# Patient Record
Sex: Male | Born: 1975 | Race: White | Hispanic: No | Smoking: Current every day smoker
Health system: Southern US, Community
[De-identification: ages and names within clinical notes are randomized; demographics above are authoritative.]

---

## 2014-08-31 ENCOUNTER — Encounter (HOSPITAL_COMMUNITY): Payer: Self-pay

## 2014-08-31 ENCOUNTER — Emergency Department (HOSPITAL_COMMUNITY)
Admission: EM | Admit: 2014-08-31 | Discharge: 2014-08-31 | Disposition: A | Discharge status: Home / Self Care | Attending: Emergency Medicine | Admitting: Emergency Medicine

## 2014-08-31 ENCOUNTER — Emergency Department (HOSPITAL_COMMUNITY)

## 2014-08-31 DIAGNOSIS — N201 Calculus of ureter: Secondary | ICD-10-CM | POA: Diagnosis not present

## 2014-08-31 DIAGNOSIS — Z720 Tobacco use: Secondary | ICD-10-CM | POA: Diagnosis not present

## 2014-08-31 DIAGNOSIS — R109 Unspecified abdominal pain: Secondary | ICD-10-CM

## 2014-08-31 LAB — CBC WITH DIFFERENTIAL/PLATELET
BASOS PCT: 0 % (ref 0–1)
Basophils Absolute: 0 10*3/uL (ref 0.0–0.1)
Eosinophils Absolute: 0.1 10*3/uL (ref 0.0–0.7)
Eosinophils Relative: 1 % (ref 0–5)
HCT: 41.1 % (ref 39.0–52.0)
Hemoglobin: 13.8 g/dL (ref 13.0–17.0)
LYMPHS ABS: 0.8 10*3/uL (ref 0.7–4.0)
Lymphocytes Relative: 12 % (ref 12–46)
MCH: 30.1 pg (ref 26.0–34.0)
MCHC: 33.6 g/dL (ref 30.0–36.0)
MCV: 89.5 fL (ref 78.0–100.0)
MONOS PCT: 5 % (ref 3–12)
Monocytes Absolute: 0.4 10*3/uL (ref 0.1–1.0)
NEUTROS ABS: 5.6 10*3/uL (ref 1.7–7.7)
Neutrophils Relative %: 82 % — ABNORMAL HIGH (ref 43–77)
PLATELETS: 170 10*3/uL (ref 150–400)
RBC: 4.59 MIL/uL (ref 4.22–5.81)
RDW: 13 % (ref 11.5–15.5)
WBC: 6.9 10*3/uL (ref 4.0–10.5)

## 2014-08-31 LAB — BASIC METABOLIC PANEL
Anion gap: 5 (ref 5–15)
BUN: 21 mg/dL (ref 6–23)
CHLORIDE: 109 mmol/L (ref 96–112)
CO2: 26 mmol/L (ref 19–32)
Calcium: 9.2 mg/dL (ref 8.4–10.5)
Creatinine, Ser: 0.97 mg/dL (ref 0.50–1.35)
GFR calc Af Amer: >90 mL/min (ref >90)
GFR calc non Af Amer: >90 mL/min (ref >90)
GLUCOSE: 97 mg/dL (ref 70–99)
POTASSIUM: 3.5 mmol/L (ref 3.5–5.1)
SODIUM: 140 mmol/L (ref 135–145)

## 2014-08-31 MED ORDER — SODIUM CHLORIDE 0.9 % IV SOLN
1000.0000 mL | Freq: Once | INTRAVENOUS | Status: AC
  Administered 2014-08-31: 1000 mL via INTRAVENOUS

## 2014-08-31 MED ORDER — ONDANSETRON HCL 4 MG/2ML IJ SOLN
4.0000 mg | Freq: Once | INTRAMUSCULAR | Status: AC
  Administered 2014-08-31: 4 mg via INTRAVENOUS
  Filled 2014-08-31: qty 2

## 2014-08-31 MED ORDER — NAPROXEN 500 MG PO TABS: 500.0000 mg | ORAL_TABLET | Freq: Two times a day (BID) | ORAL | Status: AC

## 2014-08-31 MED ORDER — MORPHINE SULFATE 4 MG/ML IJ SOLN
4.0000 mg | Freq: Once | INTRAMUSCULAR | Status: AC
  Administered 2014-08-31: 4 mg via INTRAVENOUS
  Filled 2014-08-31: qty 1

## 2014-08-31 MED ORDER — KETOROLAC TROMETHAMINE 30 MG/ML IJ SOLN
30.0000 mg | Freq: Once | INTRAMUSCULAR | Status: AC
  Administered 2014-08-31: 30 mg via INTRAVENOUS
  Filled 2014-08-31: qty 1

## 2014-08-31 MED ORDER — SODIUM CHLORIDE 0.9 % IV SOLN
1000.0000 mL | INTRAVENOUS | Status: DC
  Administered 2014-08-31: 1000 mL via INTRAVENOUS

## 2014-08-31 NOTE — Discharge Instructions (Signed)
Kidney Stones °Kidney stones (urolithiasis) are deposits that form inside your kidneys. The intense pain is caused by the stone moving through the urinary tract. When the stone moves, the ureter goes into spasm around the stone. The stone is usually passed in the urine.  °CAUSES  °· A disorder that makes certain neck glands produce too much parathyroid hormone (primary hyperparathyroidism). °· A buildup of uric acid crystals, similar to gout in your joints. °· Narrowing (stricture) of the ureter. °· A kidney obstruction present at birth (congenital obstruction). °· Previous surgery on the kidney or ureters. °· Numerous kidney infections. °SYMPTOMS  °· Feeling sick to your stomach (nauseous). °· Throwing up (vomiting). °· Blood in the urine (hematuria). °· Pain that usually spreads (radiates) to the groin. °· Frequency or urgency of urination. °DIAGNOSIS  °· Taking a history and physical exam. °· Blood or urine tests. °· CT scan. °· Occasionally, an examination of the inside of the urinary bladder (cystoscopy) is performed. °TREATMENT  °· Observation. °· Increasing your fluid intake. °· Extracorporeal shock wave lithotripsy--This is a noninvasive procedure that uses shock waves to break up kidney stones. °· Surgery may be needed if you have severe pain or persistent obstruction. There are various surgical procedures. Most of the procedures are performed with the use of small instruments. Only small incisions are needed to accommodate these instruments, so recovery time is minimized. °The size, location, and chemical composition are all important variables that will determine the proper choice of action for you. Talk to your health care provider to better understand your situation so that you will minimize the risk of injury to yourself and your kidney.  °HOME CARE INSTRUCTIONS  °· Drink enough water and fluids to keep your urine clear or pale yellow. This will help you to pass the stone or stone fragments. °· Strain  all urine through the provided strainer. Keep all particulate matter and stones for your health care provider to see. The stone causing the pain may be as small as a grain of salt. It is very important to use the strainer each and every time you pass your urine. The collection of your stone will allow your health care provider to analyze it and verify that a stone has actually passed. The stone analysis will often identify what you can do to reduce the incidence of recurrences. °· Only take over-the-counter or prescription medicines for pain, discomfort, or fever as directed by your health care provider. °· Make a follow-up appointment with your health care provider as directed. °· Get follow-up X-rays if required. The absence of pain does not always mean that the stone has passed. It may have only stopped moving. If the urine remains completely obstructed, it can cause loss of kidney function or even complete destruction of the kidney. It is your responsibility to make sure X-rays and follow-ups are completed. Ultrasounds of the kidney can show blockages and the status of the kidney. Ultrasounds are not associated with any radiation and can be performed easily in a matter of minutes. °SEEK MEDICAL CARE IF: °· You experience pain that is progressive and unresponsive to any pain medicine you have been prescribed. °SEEK IMMEDIATE MEDICAL CARE IF:  °· Pain cannot be controlled with the prescribed medicine. °· You have a fever or shaking chills. °· The severity or intensity of pain increases over 18 hours and is not relieved by pain medicine. °· You develop a new onset of abdominal pain. °· You feel faint or pass out. °·   You are unable to urinate. MAKE SURE YOU:   Understand these instructions.  Will watch your condition.  Will get help right away if you are not doing well or get worse. Document Released: 06/09/2005 Document Revised: 02/09/2013 Document Reviewed: 11/10/2012 [facility] Patient Information 2015  [place], [place]. This information is not intended to replace advice given to you by your health care provider. Make sure you discuss any questions you have with your health care provider.  Naproxen and naproxen sodium oral immediate-release tablets What is this medicine? NAPROXEN (na PROX en) is a non-steroidal anti-inflammatory drug (NSAID). It is used to reduce swelling and to treat pain. This medicine may be used for dental pain, headache, or painful monthly periods. It is also used for painful joint and muscular problems such as arthritis, tendinitis, bursitis, and gout. This medicine may be used for other purposes; ask your health care provider or pharmacist if you have questions. COMMON BRAND NAME(S): Aflaxen, Aleve, Aleve Arthritis, All Day Relief, Anaprox, Anaprox DS, Naprosyn What should I tell my health care provider before I take this medicine? They need to know if you have any of these conditions: -asthma -cigarette smoker -drink more than 3 alcohol containing drinks a day -heart disease or circulation problems such as heart failure or leg edema (fluid retention) -high blood pressure -kidney disease -liver disease -stomach bleeding or ulcers -an unusual or allergic reaction to naproxen, aspirin, other NSAIDs, other medicines, foods, dyes, or preservatives -pregnant or trying to get pregnant -breast-feeding How should I use this medicine? Take this medicine by mouth with a glass of water. Follow the directions on the prescription label. Take it with food if your stomach gets upset. Try to not lie down for at least 10 minutes after you take it. Take your medicine at regular intervals. Do not take your medicine more often than directed. Long-term, continuous use may increase the risk of heart attack or stroke. A special MedGuide will be given to you by the pharmacist with each prescription and refill. Be sure to read this information carefully each time. Talk to your pediatrician  regarding the use of this medicine in children. Special care may be needed. Overdosage: If you think you have taken too much of this medicine contact a poison control center or emergency room at once. NOTE: This medicine is only for you. Do not share this medicine with others. What if I miss a dose? If you miss a dose, take it as soon as you can. If it is almost time for your next dose, take only that dose. Do not take double or extra doses. What may interact with this medicine? -alcohol -aspirin -cidofovir -diuretics -lithium -methotrexate -other drugs for inflammation like ketorolac or prednisone -pemetrexed -probenecid -warfarin This list may not describe all possible interactions. Give your health care provider a list of all the medicines, herbs, non-prescription drugs, or dietary supplements you use. Also tell them if you smoke, drink alcohol, or use illegal drugs. Some items may interact with your medicine. What should I watch for while using this medicine? Tell your doctor or health care professional if your pain does not get better. Talk to your doctor before taking another medicine for pain. Do not treat yourself. This medicine does not prevent heart attack or stroke. In fact, this medicine may increase the chance of a heart attack or stroke. The chance may increase with longer use of this medicine and in people who have heart disease. If you take aspirin to prevent heart  attack or stroke, talk with your doctor or health care professional. Do not take other medicines that contain aspirin, ibuprofen, or naproxen with this medicine. Side effects such as stomach upset, nausea, or ulcers may be more likely to occur. Many medicines available without a prescription should not be taken with this medicine. This medicine can cause ulcers and bleeding in the stomach and intestines at any time during treatment. Do not smoke cigarettes or drink alcohol. These increase irritation to your stomach and  can make it more susceptible to damage from this medicine. Ulcers and bleeding can happen without warning symptoms and can cause death. You may get drowsy or dizzy. Do not drive, use machinery, or do anything that needs mental alertness until you know how this medicine affects you. Do not stand or sit up quickly, especially if you are an older patient. This reduces the risk of dizzy or fainting spells. This medicine can cause you to bleed more easily. Try to avoid damage to your teeth and gums when you brush or floss your teeth. What side effects may I notice from receiving this medicine? Side effects that you should report to your doctor or health care professional as soon as possible: -black or bloody stools, blood in the urine or vomit -blurred vision -chest pain -difficulty breathing or wheezing -nausea or vomiting -severe stomach pain -skin rash, skin redness, blistering or peeling skin, hives, or itching -slurred speech or weakness on one side of the body -swelling of eyelids, throat, lips -unexplained weight gain or swelling -unusually weak or tired -yellowing of eyes or skin Side effects that usually do not require medical attention (report to your doctor or health care professional if they continue or are bothersome): -constipation -headache -heartburn This list may not describe all possible side effects. Call your doctor for medical advice about side effects. You may report side effects to FDA at 1-800-FDA-1088. Where should I keep my medicine? Keep out of the reach of children. Store at room temperature between 15 and 30 degrees C (59 and 86 degrees F). Keep container tightly closed. Throw away any unused medicine after the expiration date. NOTE: This sheet is a summary. It may not cover all possible information. If you have questions about this medicine, talk to your doctor, pharmacist, or health care provider.  2015, [employer]. (2009-06-11 20:10:16)

## 2014-08-31 NOTE — ED Notes (Signed)
Left flank pain all night, intermittently worse at times

## 2014-08-31 NOTE — ED Provider Notes (Signed)
CSN: [id number]     Arrival date & time 08/31/14  0422 History   First MD Initiated Contact with Patient 08/31/14 [phone]     Chief Complaint  Patient presents with  . Flank Pain     (Consider location/radiation/quality/duration/timing/severity/associated sxs/prior Treatment) Patient is a 39 y.o. male presenting with flank pain. The history is provided by the patient.  Flank Pain  He had onset about midnight of severe left flank pain. Pain waxes and wanes. It is severe and he rates it at 10/10. Nothing makes it better nothing makes it worse. There is slight radiation to the left midabdomen. He denies fever, chills, sweats. There was nausea and vomiting. He thought he had to have a bowel movement but pain did not get any better after having a bowel movement. He denies any difficulty urinating. His never had pain like this before. There has been no treatment prior to arriving in the ED.  History reviewed. No pertinent past medical history. History reviewed. No pertinent past surgical history. No family history on file. History  Substance Use Topics  . Smoking status: Current Every Day Smoker  . Smokeless tobacco: Not on file  . Alcohol Use: No    Review of Systems  Genitourinary: Positive for flank pain.  All other systems reviewed and are negative.     Allergies  Review of patient's allergies indicates no known allergies.  Home Medications   Prior to Admission medications   Not on File   BP 117/55 mmHg  Pulse 63  Temp(Src) 98.8 F (37.1 C) (Oral)  Resp 20  Ht  (1.702 m)  Wt 177 lb (80.287 kg)  BMI 27.72 kg/m2  SpO2 98% Physical Exam  Nursing note and vitals reviewed.  39 year old male, resting comfortably and in no acute distress. Vital signs are normal. Oxygen saturation is 98%, which is normal. Head is normocephalic and atraumatic. PERRLA, EOMI. Oropharynx is clear. Neck is nontender and supple without adenopathy or JVD. Back is nontender in the midline.  There is moderate left CVA tenderness. Lungs are clear without rales, wheezes, or rhonchi. Chest is nontender. Heart has regular rate and rhythm without murmur. Abdomen is soft, flat, nontender without masses or hepatosplenomegaly and peristalsis is hypoactive. Extremities have no cyanosis or edema, full range of motion is present. Skin is warm and dry without rash. Neurologic: Mental status is normal, cranial nerves are intact, there are no motor or sensory deficits.  ED Course  Procedures (including critical care time) Labs Review Results for orders placed or performed during the hospital encounter of 08/31/14  Basic metabolic panel  Result Value Ref Range   Sodium 140 135 - 145 mmol/L   Potassium 3.5 3.5 - 5.1 mmol/L   Chloride 109 96 - 112 mmol/L   CO2 26 19 - 32 mmol/L   Glucose, Bld 97 70 - 99 mg/dL   BUN 21 6 - 23 mg/dL   Creatinine, Ser 2.95 0.50 - 1.35 mg/dL   Calcium 9.2 8.4 - 28.4 mg/dL   GFR calc non Af Amer >90 >90 mL/min   GFR calc Af Amer >90 >90 mL/min   Anion gap 5 5 - 15  CBC with Differential  Result Value Ref Range   WBC 6.9 4.0 - 10.5 K/uL   RBC 4.59 4.22 - 5.81 MIL/uL   Hemoglobin 13.8 13.0 - 17.0 g/dL   HCT 13.2 44.0 - 10.2 %   MCV 89.5 78.0 - 100.0 fL   MCH 30.1 26.0 -  34.0 pg   MCHC 33.6 30.0 - 36.0 g/dL   RDW 16.113.0 09.611.5 - 04.515.5 %   Platelets 170 150 - 400 K/uL   Neutrophils Relative % 82 (H) 43 - 77 %   Neutro Abs 5.6 1.7 - 7.7 K/uL   Lymphocytes Relative 12 12 - 46 %   Lymphs Abs 0.8 0.7 - 4.0 K/uL   Monocytes Relative 5 3 - 12 %   Monocytes Absolute 0.4 0.1 - 1.0 K/uL   Eosinophils Relative 1 0 - 5 %   Eosinophils Absolute 0.1 0.0 - 0.7 K/uL   Basophils Relative 0 0 - 1 %   Basophils Absolute 0.0 0.0 - 0.1 K/uL   Imaging Review Ct Renal Stone Study  08/31/2014   CLINICAL DATA:  Initial evaluation for acute left-sided flank pain.  EXAM: CT ABDOMEN AND PELVIS WITHOUT CONTRAST  TECHNIQUE: Multidetector CT imaging of the abdomen and pelvis was  performed following the standard protocol without IV contrast.  COMPARISON:  None.  FINDINGS: The visualized lung bases are clear. No pleural or pericardial effusion.  The liver demonstrates a normal unenhanced appearance. Gallbladder within normal limits. No biliary dilatation. Spleen, adrenal glands, and pancreas are within normal limits.  On the right, there is a nonobstructive 3 mm calculus within the upper pole the right kidney. No radiopaque stone seen along the course of the right renal collecting system. There is no right-sided hydronephrosis or hydroureter.  On the left, there is a in obstructive 3 mm stone in the bladder lumen near the left UVJ. There is secondary a mild left hydroureteronephrosis. No other radiopaque calculi identified within the left kidney or along the course of the left renal collecting system.  Stomach within normal limits. No evidence for bowel obstruction. No abnormal wall thickening or inflammatory changes seen about the bowels. No evidence for acute appendicitis.  Bladder within normal limits.  Prostate normal.  No free air or fluid.  No adenopathy.  No acute osseous abnormality. No worrisome lytic or blastic osseous lesions.  IMPRESSION: 1. 3 mm calculus within the bladder lumen just beyond the left UVJ with secondary mild left hydroureteronephrosis. No other left-sided calculi identified. 2. 3 mm nonobstructive right renal calculus. 3. No other acute intra-abdominal or pelvic process.   Electronically Signed   By: Rise MuBenjamin  McClintock M.D.   On: 08/31/2014 05:42   Images viewed by me.  MDM   Final diagnoses:  Left flank pain  Ureterolithiasis    Left flank pain suspicious for ureteral colic. He will be sent for CT scan. Is given IV fluids, ondansetron for nausea, and morphine for pain. Old records are reviewed and he has no [facility] and [facility].  He feels much better after above noted treatment. CT scan shows calculus either at the distal  ureterovesical junction or in the bladder. He is a dose of ketorolac. Following this, he is pain-free. Official reading of CT scan is appreciated. Since stone has already passed into bladder, he does not need to be on tamsulosin. He is discharged with prescription for naproxen.  Dione Boozeavid Seven Dollens, MD 08/31/14 0700

## 2015-10-29 IMAGING — CT CT RENAL STONE PROTOCOL
2 of 4 series · 16 of 46 positions shown, 18 images · non-contrast
Comparison: None.

CLINICAL DATA: Initial evaluation for acute left-sided flank pain.

EXAM:
CT ABDOMEN AND PELVIS WITHOUT CONTRAST
TECHNIQUE: Multidetector CT imaging of the abdomen and pelvis was performed
following the standard protocol without IV contrast.

[Series 2: standard/full over (age)lbs 5.0 · axial · 0.71mm/px · z∈[-530,-124]mm · 13 of 91 slices shown, 15 images]
[im 5/91  soft-tissue]
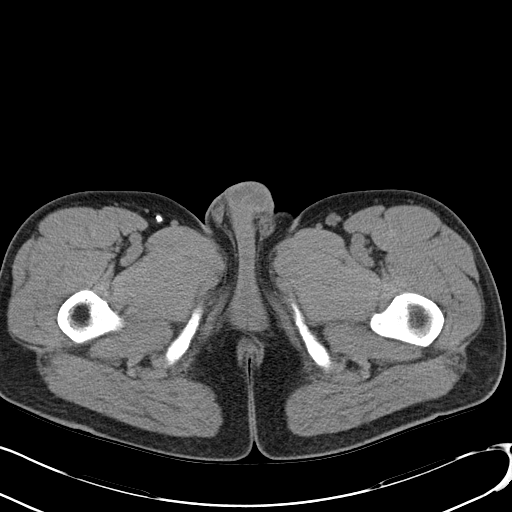
[im 5/91  bone]
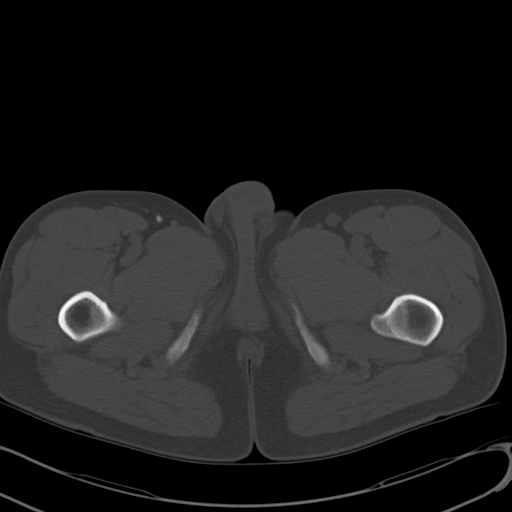
[im 13/91  soft-tissue]
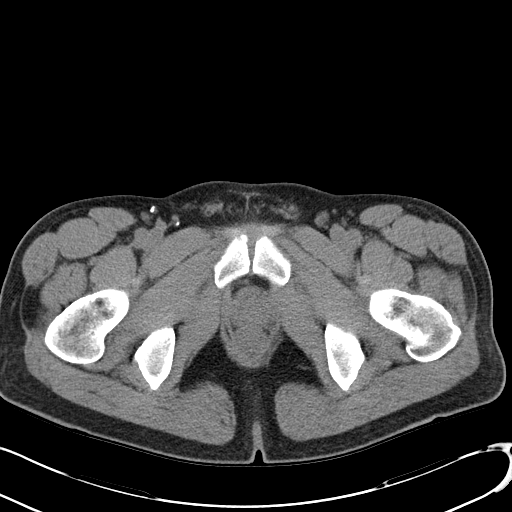
[im 21/91  soft-tissue]
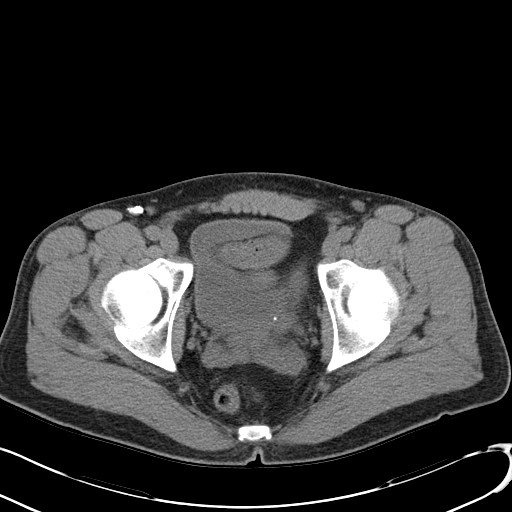
[im 25/91  soft-tissue]
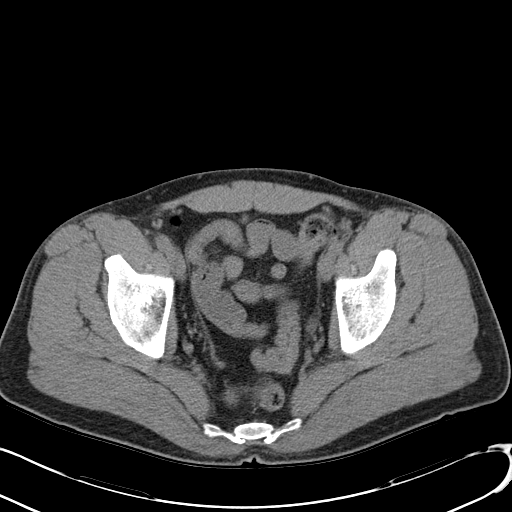
[im 33/91  soft-tissue]
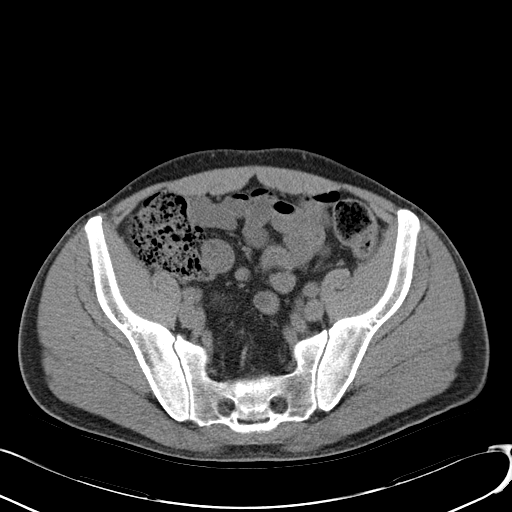
[im 37/91  soft-tissue]
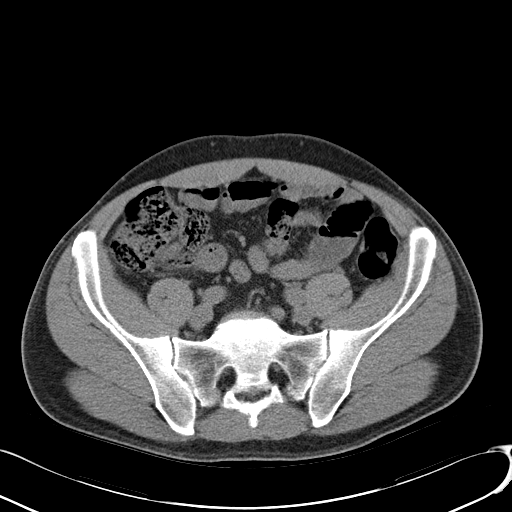
[im 46/91  soft-tissue]
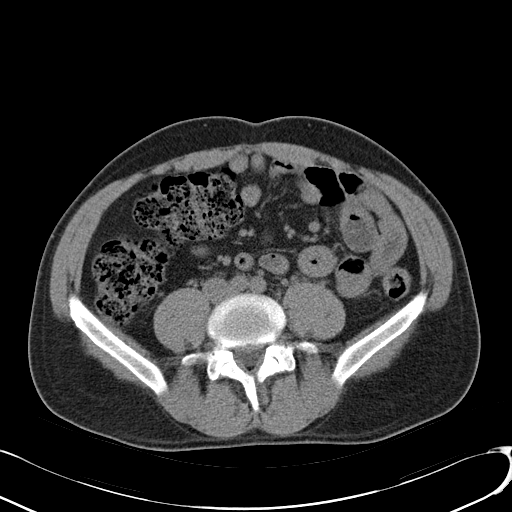
[im 54/91  soft-tissue]
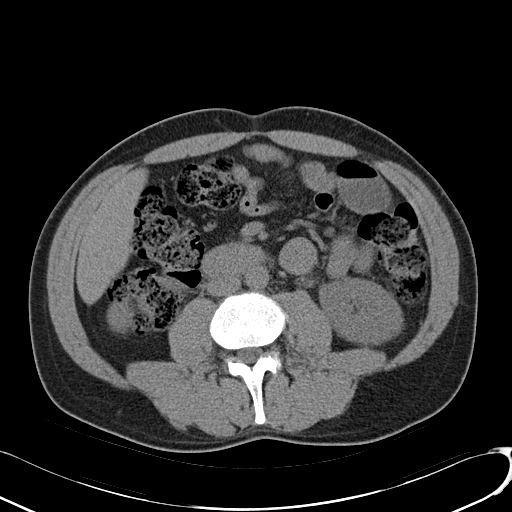
[im 58/91  soft-tissue]
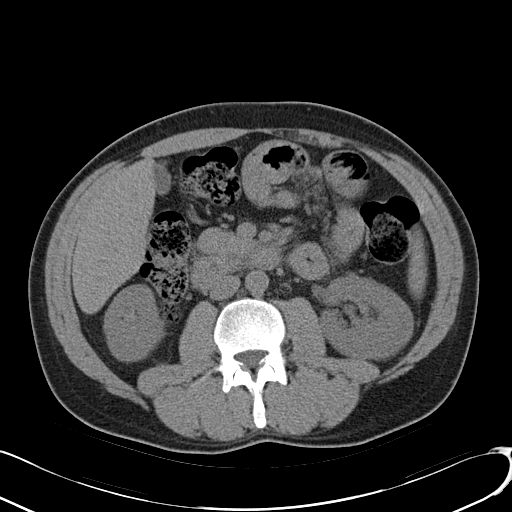
[im 58/91  bone]
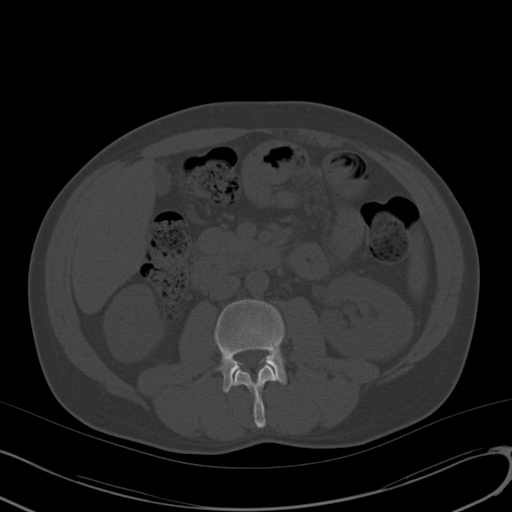
[im 66/91  soft-tissue]
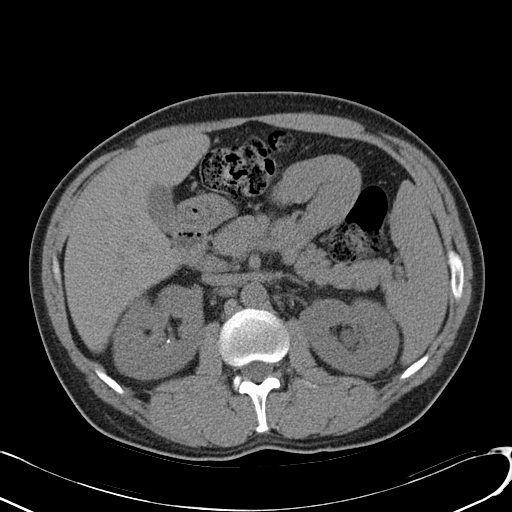
[im 70/91  soft-tissue]
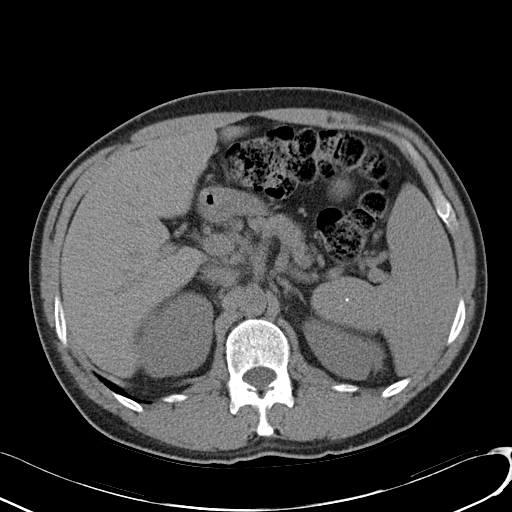
[im 78/91  soft-tissue]
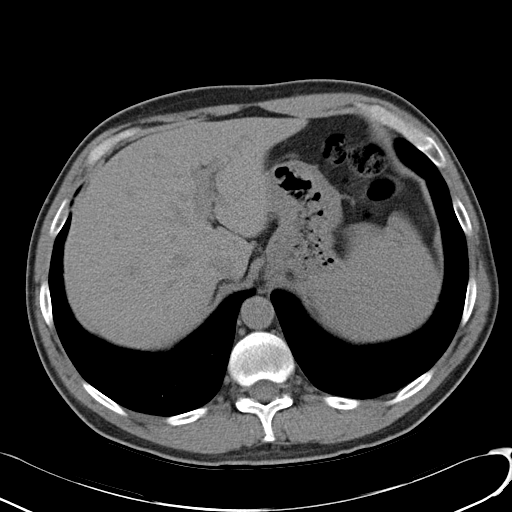
[im 86/91  soft-tissue]
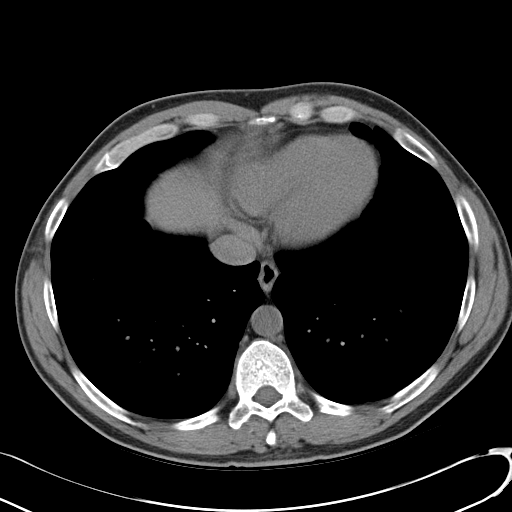

[Series 3: mpr coronal · coronal · 0.81mm/px · 3 of 79 slices shown]
[im 27/79  soft-tissue]
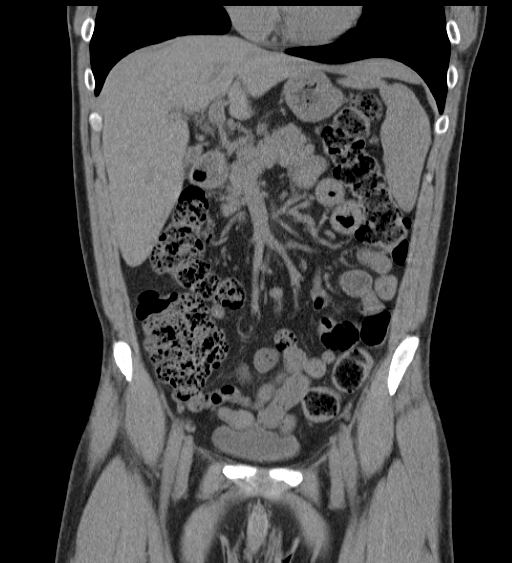
[im 35/79  soft-tissue]
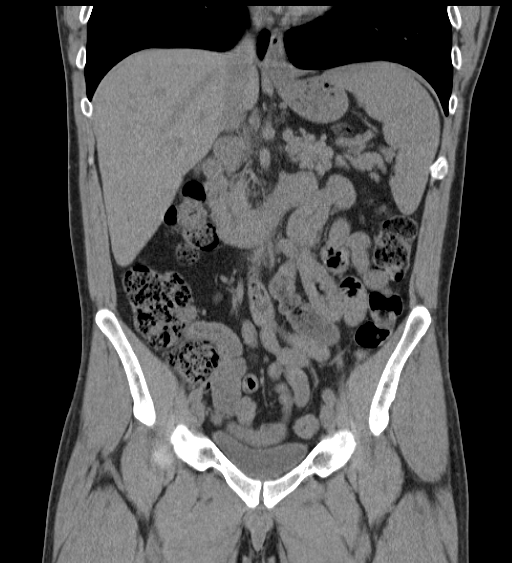
[im 44/79  soft-tissue]
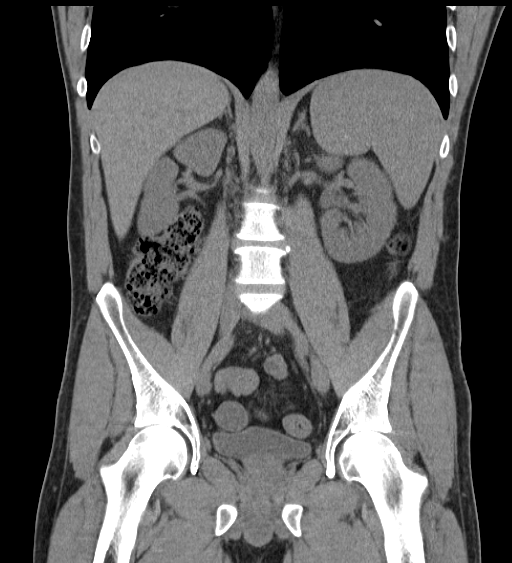

[16 of 46 positions shown; findings below may reference images not displayed]

FINDINGS: The visualized lung bases are clear. No pleural or pericardial
effusion.

The liver demonstrates a normal unenhanced appearance. Gallbladder
within normal limits. No biliary dilatation. Spleen, adrenal glands,
and pancreas are within normal limits.

On the right, there is a nonobstructive 3 mm calculus within the
upper pole the right kidney. No radiopaque stone seen along the
course of the right renal collecting system. There is no right-sided
hydronephrosis or hydroureter.

On the left, there is a in obstructive 3 mm stone in the bladder
lumen near the left UVJ. There is secondary a mild left
hydroureteronephrosis. No other radiopaque calculi identified within
the left kidney or along the course of the left renal collecting
system.

Stomach within normal limits. No evidence for bowel obstruction. No
abnormal wall thickening or inflammatory changes seen about the
bowels. No evidence for acute appendicitis.

Bladder within normal limits.  Prostate normal.

No free air or fluid.  No adenopathy.

No acute osseous abnormality. No worrisome lytic or blastic osseous
lesions.
IMPRESSION: 1. 3 mm calculus within the bladder lumen just beyond the left UVJ
with secondary mild left hydroureteronephrosis. No other left-sided
calculi identified.
2. 3 mm nonobstructive right renal calculus.
3. No other acute intra-abdominal or pelvic process.
